# Patient Record
Sex: Male | Born: 1975 | Race: Black or African American | Hispanic: No | Marital: Single
Health system: Southern US, Community
[De-identification: ages and names within clinical notes are randomized; demographics above are authoritative.]

---

## 2014-05-17 ENCOUNTER — Other Ambulatory Visit: Payer: Self-pay | Admitting: Adult Health

## 2014-05-17 ENCOUNTER — Ambulatory Visit (INDEPENDENT_AMBULATORY_CARE_PROVIDER_SITE_OTHER): Payer: Self-pay

## 2014-05-17 DIAGNOSIS — Z0000 Encounter for general adult medical examination without abnormal findings: Secondary | ICD-10-CM

## 2014-05-17 DIAGNOSIS — Z0289 Encounter for other administrative examinations: Secondary | ICD-10-CM

## 2015-09-14 IMAGING — CR DG CHEST 1V
1 series · 1 of 1 positions shown · non-contrast
Comparison: None.

CLINICAL DATA: 37-year-old male for pre-employment physical.

EXAM:
CHEST - 1 VIEW

[view not recorded]
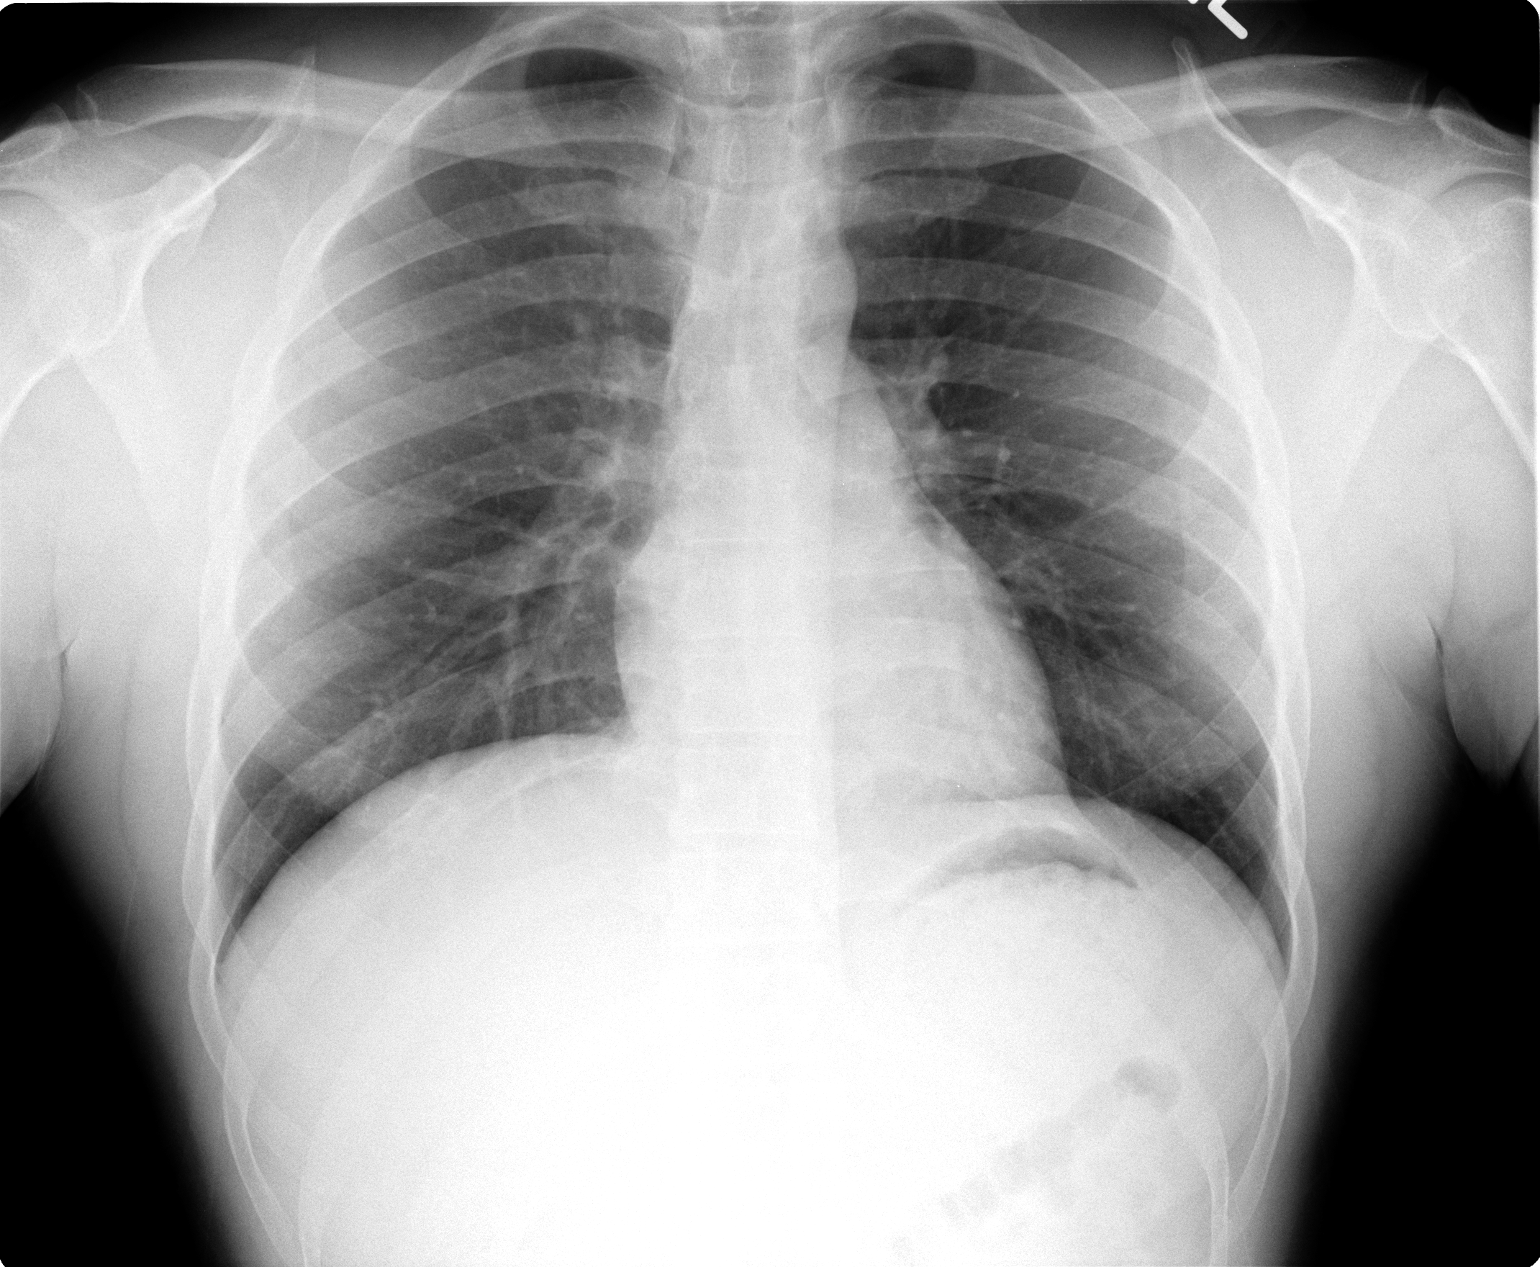

[1 of 1 positions shown; findings below may reference images not displayed]

FINDINGS: The cardiomediastinal silhouette is unremarkable.

The lungs are clear.

There is no evidence of focal airspace disease, pulmonary edema,
suspicious pulmonary nodule/mass, pleural effusion, or pneumothorax.
No acute bony abnormalities are identified.
IMPRESSION: No active disease.
